# Patient Record
Sex: Female | Born: 1995 | Race: White | Hispanic: No | Marital: Single | Smoking: Never smoker
Health system: Southern US, Community
[De-identification: ages and names within clinical notes are randomized; demographics above are authoritative.]

---

## 2017-02-22 ENCOUNTER — Emergency Department
Admission: EM | Admit: 2017-02-22 | Discharge: 2017-02-22 | Disposition: A | Discharge status: Home / Self Care | Payer: BLUE CROSS/BLUE SHIELD | Attending: Emergency Medicine | Admitting: Emergency Medicine

## 2017-02-22 ENCOUNTER — Other Ambulatory Visit: Payer: Self-pay

## 2017-02-22 ENCOUNTER — Emergency Department: Payer: BLUE CROSS/BLUE SHIELD

## 2017-02-22 ENCOUNTER — Encounter: Payer: Self-pay | Admitting: Emergency Medicine

## 2017-02-22 DIAGNOSIS — B9689 Other specified bacterial agents as the cause of diseases classified elsewhere: Secondary | ICD-10-CM | POA: Diagnosis not present

## 2017-02-22 DIAGNOSIS — R1030 Lower abdominal pain, unspecified: Secondary | ICD-10-CM | POA: Insufficient documentation

## 2017-02-22 DIAGNOSIS — N760 Acute vaginitis: Secondary | ICD-10-CM | POA: Diagnosis not present

## 2017-02-22 LAB — COMPREHENSIVE METABOLIC PANEL WITH GFR
ALT: 14 U/L (ref 14–54)
AST: 19 U/L (ref 15–41)
Albumin: 4.5 g/dL (ref 3.5–5.0)
Alkaline Phosphatase: 65 U/L (ref 38–126)
Anion gap: 10 (ref 5–15)
BUN: 9 mg/dL (ref 6–20)
CO2: 23 mmol/L (ref 22–32)
Calcium: 9.8 mg/dL (ref 8.9–10.3)
Chloride: 104 mmol/L (ref 101–111)
Creatinine, Ser: 0.74 mg/dL (ref 0.44–1.00)
GFR calc Af Amer: >60 mL/min
GFR calc non Af Amer: >60 mL/min
Glucose, Bld: 108 mg/dL — ABNORMAL HIGH (ref 65–99)
Potassium: 3.8 mmol/L (ref 3.5–5.1)
Sodium: 137 mmol/L (ref 135–145)
Total Bilirubin: 0.8 mg/dL (ref 0.3–1.2)
Total Protein: 7.7 g/dL (ref 6.5–8.1)

## 2017-02-22 LAB — WET PREP, GENITAL
Sperm: NONE SEEN
Trich, Wet Prep: NONE SEEN
Yeast Wet Prep HPF POC: NONE SEEN

## 2017-02-22 LAB — CBC
HCT: 43 % (ref 35.0–47.0)
HEMOGLOBIN: 14.7 g/dL (ref 12.0–16.0)
MCH: 30.6 pg (ref 26.0–34.0)
MCHC: 34.3 g/dL (ref 32.0–36.0)
MCV: 89.2 fL (ref 80.0–100.0)
Platelets: 175 10*3/uL (ref 150–440)
RBC: 4.82 MIL/uL (ref 3.80–5.20)
RDW: 12.8 % (ref 11.5–14.5)
WBC: 4.9 10*3/uL (ref 3.6–11.0)

## 2017-02-22 LAB — URINALYSIS, COMPLETE (UACMP) WITH MICROSCOPIC
Bilirubin Urine: NEGATIVE
Glucose, UA: NEGATIVE mg/dL
Ketones, ur: 5 mg/dL — AB
Leukocytes, UA: NEGATIVE
Nitrite: NEGATIVE
PROTEIN: NEGATIVE mg/dL
SPECIFIC GRAVITY, URINE: 1.006 (ref 1.005–1.030)
pH: 6 (ref 5.0–8.0)

## 2017-02-22 LAB — CHLAMYDIA/NGC RT PCR (ARMC ONLY)
Chlamydia Tr: NOT DETECTED
N gonorrhoeae: NOT DETECTED

## 2017-02-22 LAB — LIPASE, BLOOD: Lipase: 30 U/L (ref 11–51)

## 2017-02-22 LAB — POCT PREGNANCY, URINE: Preg Test, Ur: NEGATIVE

## 2017-02-22 MED ORDER — SODIUM CHLORIDE 0.9 % IV BOLUS (SEPSIS)
1000.0000 mL | Freq: Once | INTRAVENOUS | Status: AC
  Administered 2017-02-22: 1000 mL via INTRAVENOUS

## 2017-02-22 MED ORDER — METRONIDAZOLE 500 MG PO TABS: 500.0000 mg | ORAL_TABLET | Freq: Once | ORAL | Status: DC

## 2017-02-22 MED ORDER — OXYCODONE-ACETAMINOPHEN 5-325 MG PO TABS
1.0000 | ORAL_TABLET | Freq: Once | ORAL | Status: AC
  Administered 2017-02-22: 1 via ORAL
  Filled 2017-02-22: qty 1

## 2017-02-22 MED ORDER — IOPAMIDOL (ISOVUE-300) INJECTION 61%
100.0000 mL | Freq: Once | INTRAVENOUS | Status: AC | PRN
  Administered 2017-02-22: 100 mL via INTRAVENOUS
  Filled 2017-02-22: qty 100

## 2017-02-22 MED ORDER — METRONIDAZOLE 500 MG PO TABS: 500.0000 mg | ORAL_TABLET | Freq: Two times a day (BID) | ORAL | 0 refills | Status: AC

## 2017-02-22 MED ORDER — IOPAMIDOL (ISOVUE-300) INJECTION 61%
15.0000 mL | INTRAVENOUS | Status: AC
  Administered 2017-02-22: 15 mL via ORAL
  Filled 2017-02-22 (×2): qty 15

## 2017-02-22 MED ORDER — IOPAMIDOL (ISOVUE-300) INJECTION 61%
30.0000 mL | Freq: Once | INTRAVENOUS | Status: DC | PRN
  Filled 2017-02-22: qty 30

## 2017-02-22 NOTE — ED Triage Notes (Signed)
Pt to ed with c/o right lower abd, near groin area pain intermittently x 1 month.  Reports scheduled for IUD replacement on Dec 19th.  Also noted vaginal spotting about 2 weeks ago.

## 2017-02-22 NOTE — ED Notes (Signed)
NAD noted at time of D/C. Pt denies questions or concerns. Pt ambulatory to the lobby at this time.  

## 2017-02-22 NOTE — ED Notes (Signed)
Pt c/o RLQ pain for the past 4 days with watery diarrhea. Pt also states she is having some spotting but is due to have her IUD changed..Marland Kitchen

## 2017-02-22 NOTE — ED Notes (Signed)
Pt finished oral contrast, Ben in CT notified. 

## 2017-02-22 NOTE — ED Provider Notes (Signed)
[facility] Emergency Department Provider Note  ____________________________________________   First MD Initiated Contact with Patient 02/22/17 1403     (approximate)  I have reviewed the triage vital signs and the nursing notes.   HISTORY  Chief Complaint Abdominal Pain   HPI Melanie Murphy is a 21 y.o. female history of irritable bowel syndrome as well as an IUD who is presenting to the emergency department with right lower quadrant abdominal pain.  Says the pain was previously generalized to the entirety of the abdomen but over the past 5 days is localized to the right lower quadrant of the abdomen.  She says this is associated with nausea as well as a brownish mucus-like vaginal discharge and diarrhea which she describes as watery.  She describes the pain as a 7-8 out of 10 right now and nonradiating.  Denies any history of kidney stones.  Says that she has not concerned about STDs.  Says that she has had the IUD in for 3 years and is scheduled to have a changed on December 19.  Says the pain sometimes radiates down her leg as well.  Says the pain is been coming and going and was severe last night and that is why she came into the emergency department after taking her exams this morning.  She denies any vomiting.  Says the pain was partially relieved with extra strength Tylenol.  Says that there was a suspicion that she also had polycystic ovarian disease in the past.  However, she says the she had an ultrasound that was inconclusive.   History reviewed. No pertinent past medical history.  There are no active problems to display for this patient.   History reviewed. No pertinent surgical history.  Prior to Admission medications   Not on File    Allergies Amoxicillin  History reviewed. No pertinent family history.  Social History Social History   Tobacco Use  . Smoking status: Never Smoker  . Smokeless tobacco: Never Used  Substance Use Topics  .  Alcohol use: Yes    Frequency: Never    Comment: 3 times a week  . Drug use: Yes    Types: Marijuana    Comment: 3 times a week    Review of Systems  Constitutional: No fever/chills Eyes: No visual changes. ENT: No sore throat. Cardiovascular: Denies chest pain. Respiratory: Denies shortness of breath. Gastrointestinal: no vomiting. No constipation. Genitourinary: Negative for dysuria. Musculoskeletal: Negative for back pain. Skin: Negative for rash. Neurological: Negative for headaches, focal weakness or numbness.   ____________________________________________   PHYSICAL EXAM:  VITAL SIGNS: ED Triage Vitals  Enc Vitals Group     BP 02/22/17 1320 (!) 147/87     Pulse Rate 02/22/17 1320 80     Resp 02/22/17 1320 18     Temp 02/22/17 1320 98.3 F (36.8 C)     Temp Source 02/22/17 1320 Oral     SpO2 02/22/17 1320 100 %     Weight 02/22/17 1321 130 lb (59 kg)     Height 02/22/17 1321 5\' 3"  (1.6 m)     Head Circumference --      Peak Flow --      Pain Score 02/22/17 1320 7     Pain Loc --      Pain Edu? --      Excl. in GC? --     Constitutional: Alert and oriented. Well appearing and in no acute distress. Eyes: Conjunctivae are normal.  Head: Atraumatic.  Nose: No congestion/rhinnorhea. Mouth/Throat: Mucous membranes are moist.  Neck: No stridor.   Cardiovascular: Normal rate, regular rhythm. Grossly normal heart sounds.  Good peripheral circulation. Respiratory: Normal respiratory effort.  No retractions. Lungs CTAB. Gastrointestinal: Soft with moderate tenderness to the right lower quadrant and mild tenderness diffusely to the rest of the abdomen.  There is no rebound or guarding.. No distention. No CVA tenderness. Genitourinary: Normal external exam without any lesions.  Speculum exam with visualization of the IUD strings coming out of the cervix associated also with a brown mucus-like discharge from the cervix.  Bimanual exam without CMT.  However, there is mild  right adnexal tenderness to palpation without uterine or left adnexal tenderness to palpation. Musculoskeletal: No lower extremity tenderness nor edema.  No joint effusions. Neurologic:  Normal speech and language. No gross focal neurologic deficits are appreciated. Skin:  Skin is warm, dry and intact. No rash noted. Psychiatric: Mood and affect are normal. Speech and behavior are normal.  ____________________________________________   LABS (all labs ordered are listed, but only abnormal results are displayed)  Labs Reviewed  WET PREP, GENITAL - Abnormal; Notable for the following components:      Result Value   Clue Cells Wet Prep HPF POC PRESENT (*)    WBC, Wet Prep HPF POC FEW (*)    All other components within normal limits  COMPREHENSIVE METABOLIC PANEL - Abnormal; Notable for the following components:   Glucose, Bld 108 (*)    All other components within normal limits  URINALYSIS, COMPLETE (UACMP) WITH MICROSCOPIC - Abnormal; Notable for the following components:   Color, Urine STRAW (*)    APPearance CLEAR (*)    Hgb urine dipstick SMALL (*)    Ketones, ur 5 (*)    Bacteria, UA RARE (*)    Squamous Epithelial / LPF 0-5 (*)    All other components within normal limits  CHLAMYDIA/NGC RT PCR ([facility] ONLY)  LIPASE, BLOOD  CBC  POC URINE PREG, ED  POCT PREGNANCY, URINE   ____________________________________________  EKG   ____________________________________________  RADIOLOGY  No acute abnormality noted on the CT scan of the abdomen and pelvis. ____________________________________________   PROCEDURES  Procedure(s) performed:   Procedures  Critical Care performed:   ____________________________________________   INITIAL IMPRESSION / ASSESSMENT AND PLAN / ED COURSE  Pertinent labs & imaging results that were available during my care of the patient were reviewed by me and considered in my medical decision making (see chart for  details).  Differential diagnosis includes, but is not limited to, acute appendicitis, renal colic, testicular torsion, urinary tract infection/pyelonephritis, prostatitis,  epididymitis, diverticulitis, small bowel obstruction or ileus, colitis, abdominal aortic aneurysm, gastroenteritis, hernia, etc.  As part of my medical decision making, I reviewed the following data within the electronic MEDICAL RECORD NUMBERNo previous records on file for review.  ----------------------------------------- 4:37 PM on 02/22/2017 -----------------------------------------  Patient with a wet prep with clue cells which is consistent with a discharge.  CT scan is reassuring without ovarian cysts or malpositioning of the IUD.  Patient will be treated for BV and will follow up with her OB/GYN in [place] on 19 December.  However, she was advised to return to the emergency department for any worsening or concerning symptoms.  She is understanding of the plan willing to comply.      ____________________________________________   FINAL CLINICAL IMPRESSION(S) / ED DIAGNOSES  Abdominal pain.  Bacterial vaginosis.    NEW MEDICATIONS STARTED DURING THIS VISIT:  This SmartLink is deprecated. Use AVSMEDLIST instead to display the medication list for a patient.   Note:  This document was prepared using Dragon voice recognition software and may include unintentional dictation errors.     Myrna BlazerSchaevitz, David Matthew, MD 02/22/17 [phone]

## 2018-06-05 IMAGING — CT CT ABD-PELV W/ CM
2 of 4 series · 16 of 46 positions shown, 18 images · IV contrast (iopamidol)
Comparison: None.

CLINICAL DATA: Right lower quadrant pain for several days

EXAM:
CT ABDOMEN AND PELVIS WITH CONTRAST
TECHNIQUE: Multidetector CT imaging of the abdomen and pelvis was performed
using the standard protocol following bolus administration of
intravenous contrast.
CONTRAST:  100mL 430HNP-0UU IOPAMIDOL (430HNP-0UU) INJECTION 61%

[Series 2: axial st · axial · 0.69mm/px · z∈[-471,-76]mm · 13 of 87 slices shown, 15 images]
[im 4/87  soft-tissue]
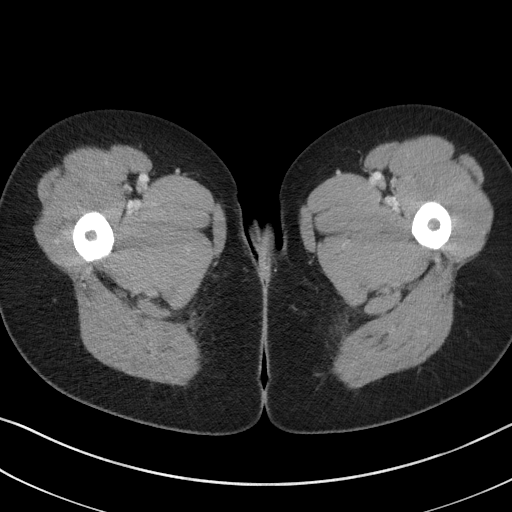
[im 4/87  bone]
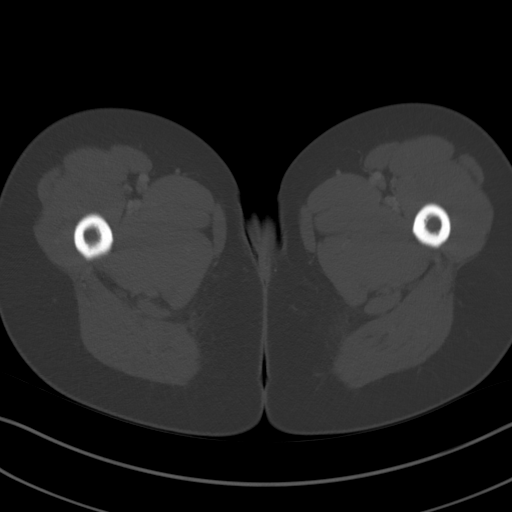
[im 11/87  soft-tissue]
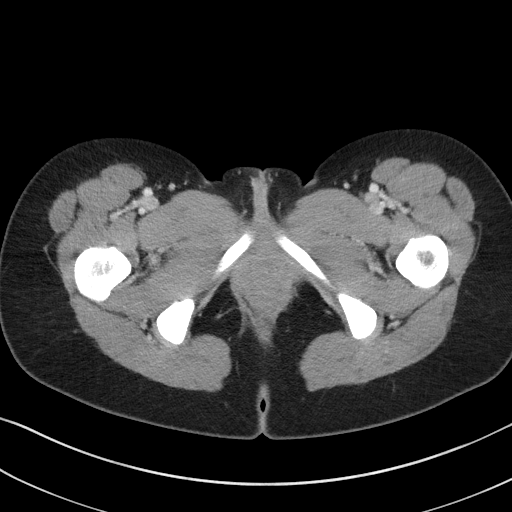
[im 18/87  soft-tissue]
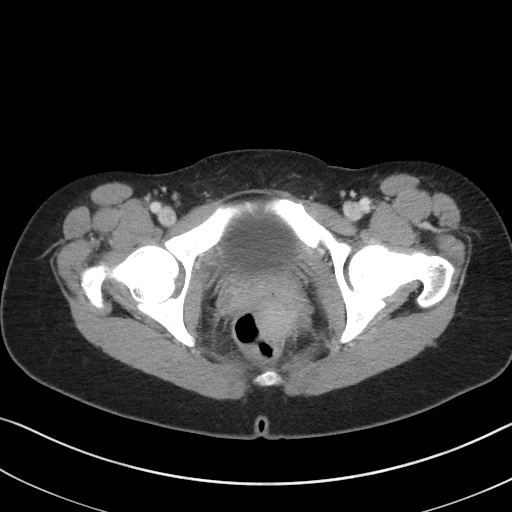
[im 26/87  soft-tissue]
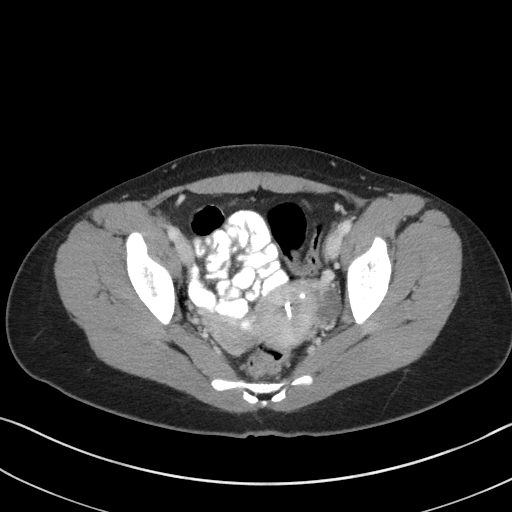
[im 29/87  soft-tissue]
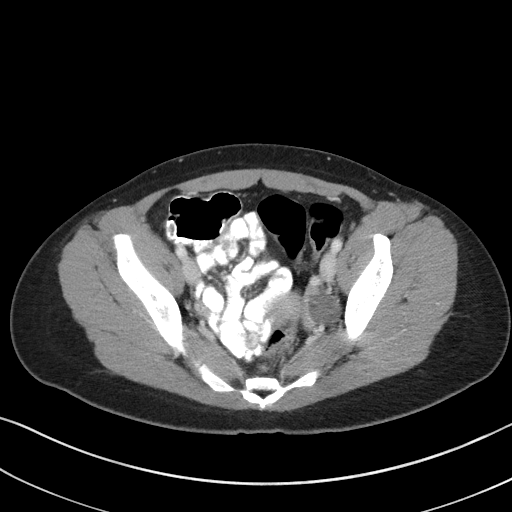
[im 36/87  soft-tissue]
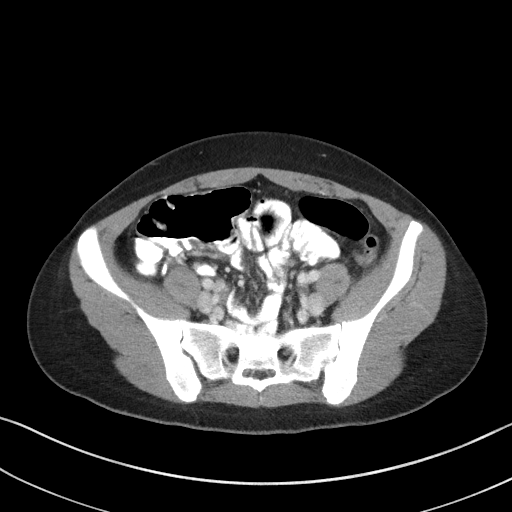
[im 44/87  soft-tissue]
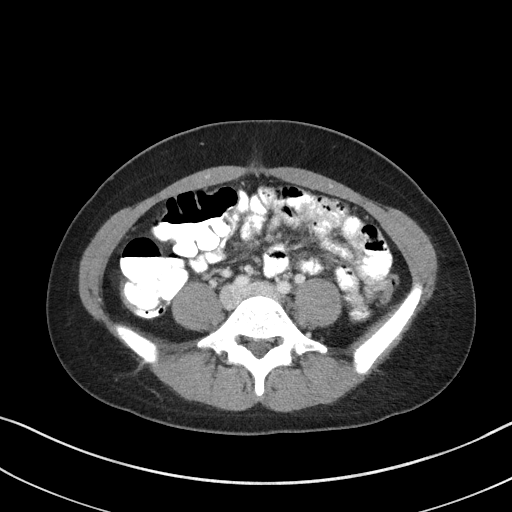
[im 51/87  soft-tissue]
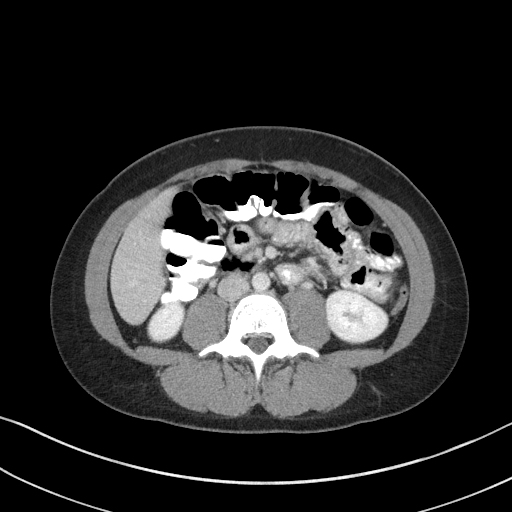
[im 58/87  soft-tissue]
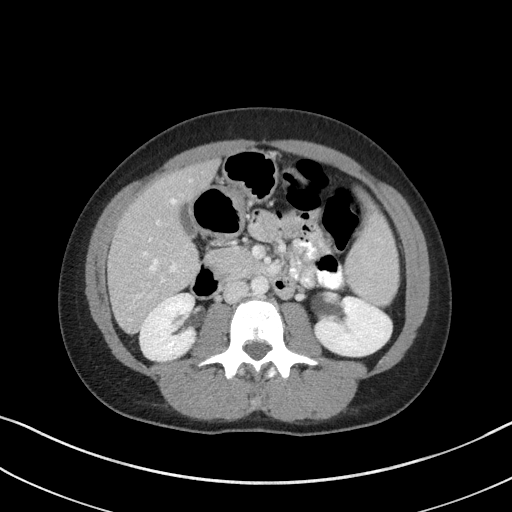
[im 58/87  bone]
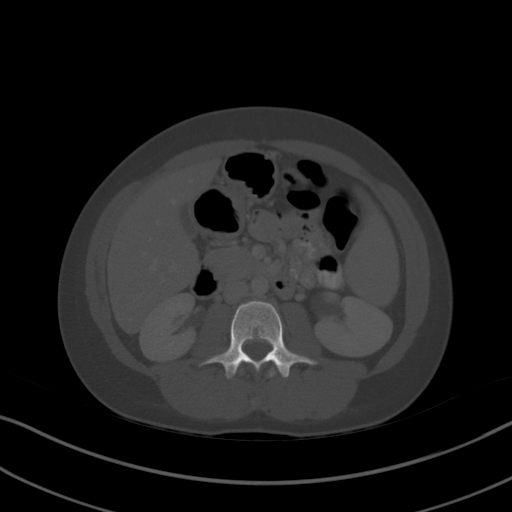
[im 61/87  soft-tissue]
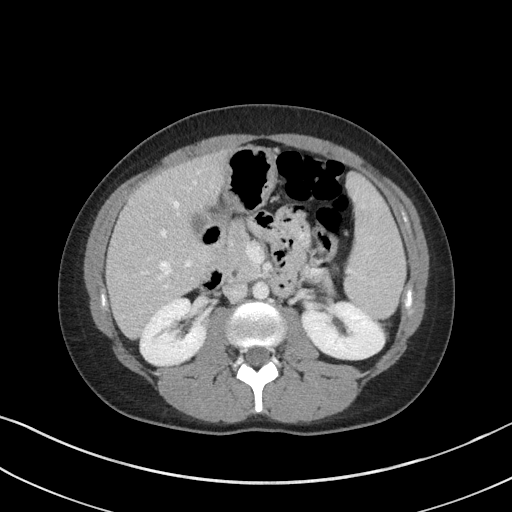
[im 69/87  soft-tissue]
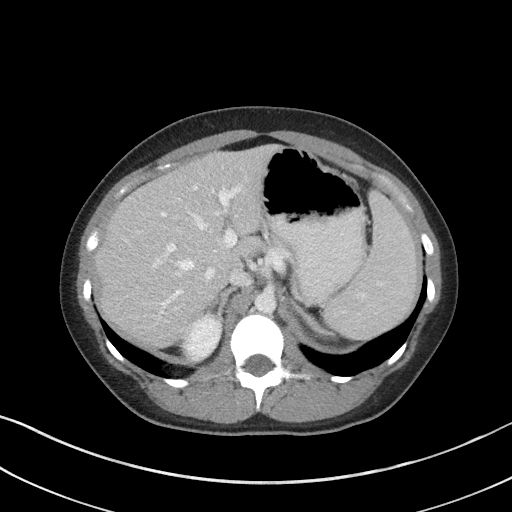
[im 76/87  soft-tissue]
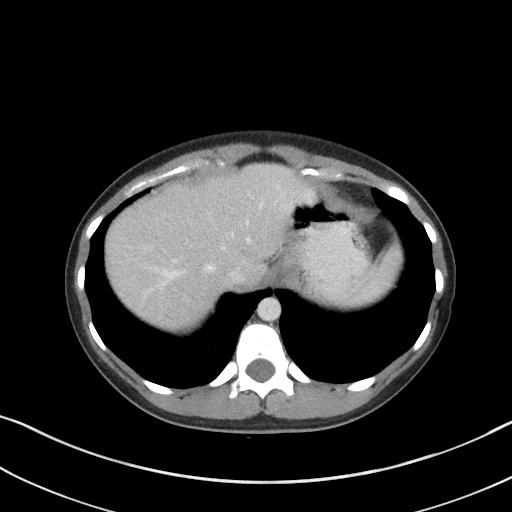
[im 83/87  soft-tissue]
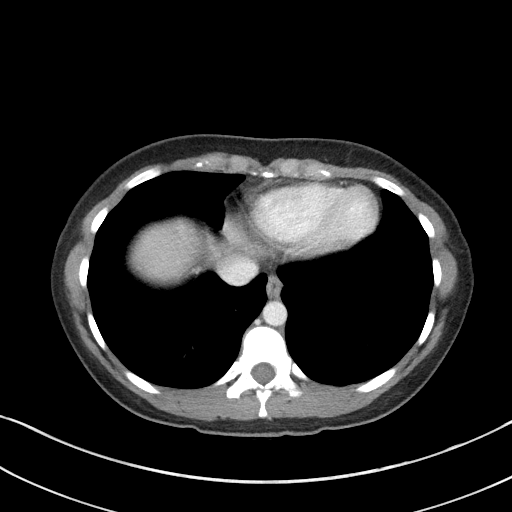

[Series 5: coronal st · coronal · 0.68mm/px · 3 of 71 slices shown]
[im 24/71  soft-tissue]
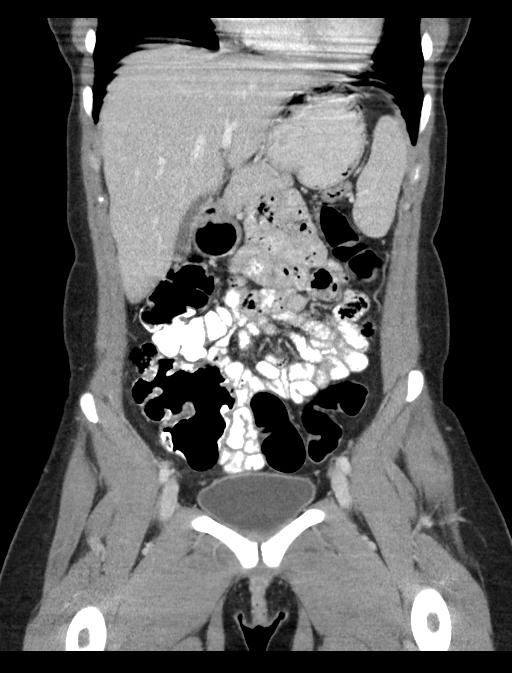
[im 32/71  soft-tissue]
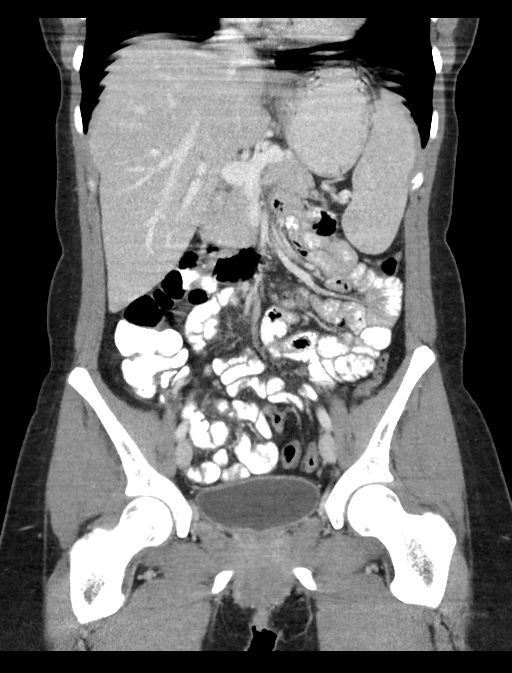
[im 39/71  soft-tissue]
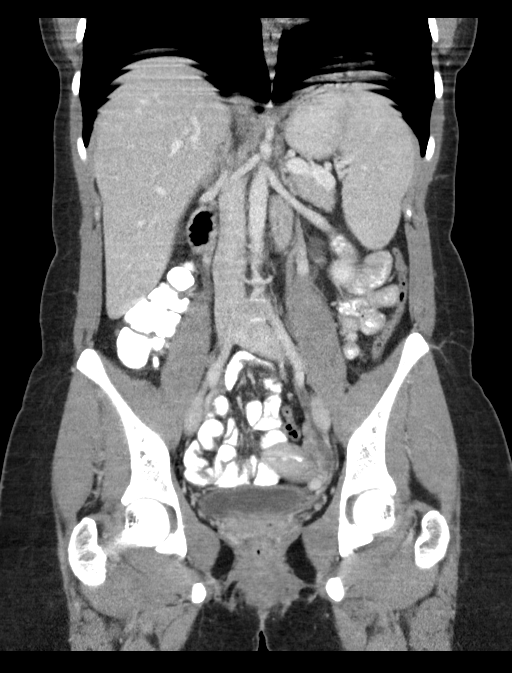

[16 of 46 positions shown; findings below may reference images not displayed]

FINDINGS: Lower chest: No acute abnormality.

Hepatobiliary: No focal liver abnormality is seen. No gallstones,
gallbladder wall thickening, or biliary dilatation.

Pancreas: Unremarkable. No pancreatic ductal dilatation or
surrounding inflammatory changes.

Spleen: Normal in size without focal abnormality.

Adrenals/Urinary Tract: The adrenal glands are within normal limits.
The kidneys are well visualized bilaterally without renal calculi or
obstructive changes. The bladder is within normal limits.

Stomach/Bowel: The appendix is filled with barium and within normal
limits. No inflammatory changes are seen. No obstructive changes of
the bowel are noted. Scattered diverticular change is seen without
diverticulitis.

Vascular/Lymphatic: No significant vascular findings are present. No
enlarged abdominal or pelvic lymph nodes.

Reproductive: An IUD is noted in place. The ovaries demonstrate
generalized decreased attenuation bilaterally likely related to
follicular change.

Other: No abdominal wall hernia or abnormality. No abdominopelvic
ascites.

Musculoskeletal: No acute or significant osseous findings.
IMPRESSION: No acute abnormality noted. Specifically the appendix is within
normal limits.
